# Patient Record
Sex: Male | Born: 1980 | Hispanic: Yes | Marital: Single
Health system: Southern US, Community
[De-identification: ages and names within clinical notes are randomized; demographics above are authoritative.]

---

## 2007-12-20 ENCOUNTER — Emergency Department: Payer: Self-pay

## 2007-12-22 ENCOUNTER — Emergency Department: Payer: Self-pay

## 2009-08-15 IMAGING — US ABDOMEN ULTRASOUND
1 series · 17 of 25 positions shown · non-contrast
Comparison: No comparison

REASON FOR EXAM: vomiting and elevated LFTs
COMMENTS:

PROCEDURE:     US  - US ABDOMEN GENERAL SURVEY  - December 20, 2007  [DATE]
RESULT:     History: 27-year-old male with vomiting

[Series 1: abdomen ultrasound · 17 of 48 slices shown]
[im 1/48]
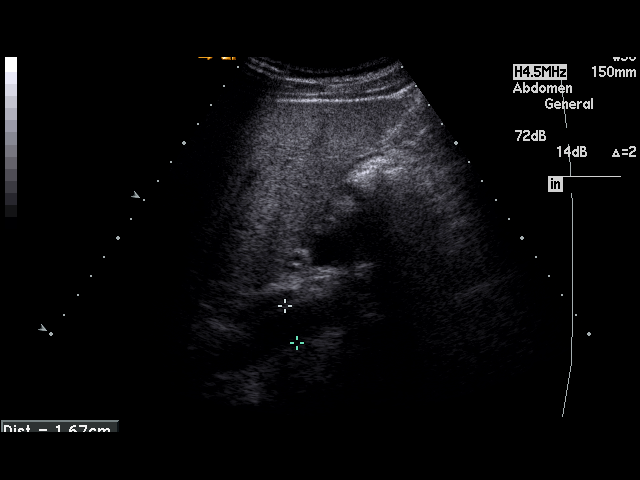
[im 4/48]
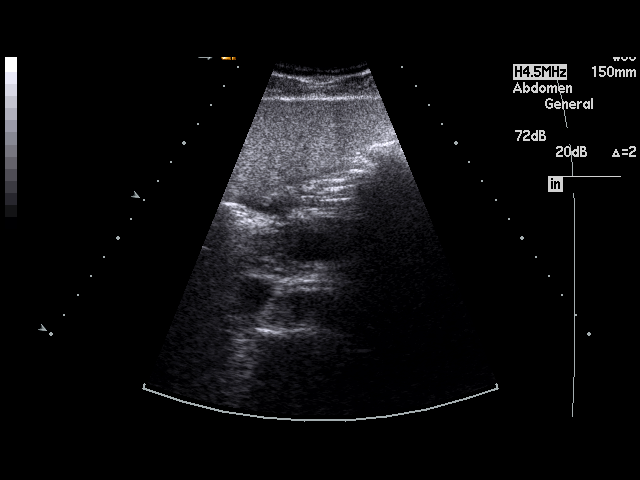
[im 6/48]
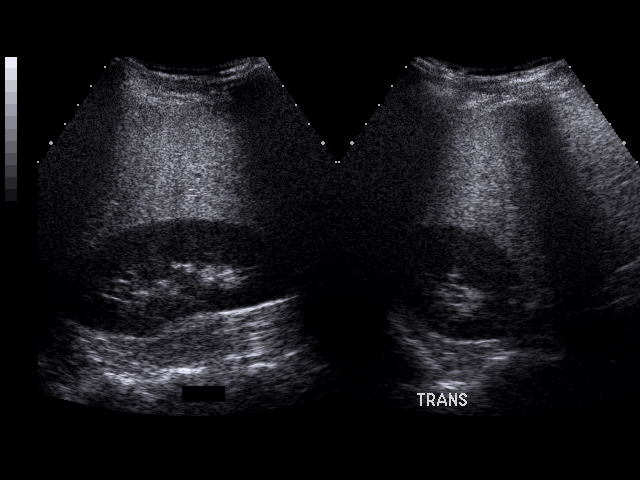
[im 10/48]
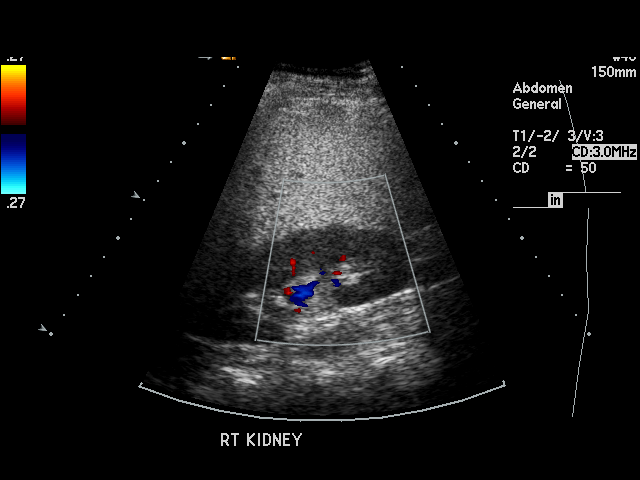
[im 12/48]
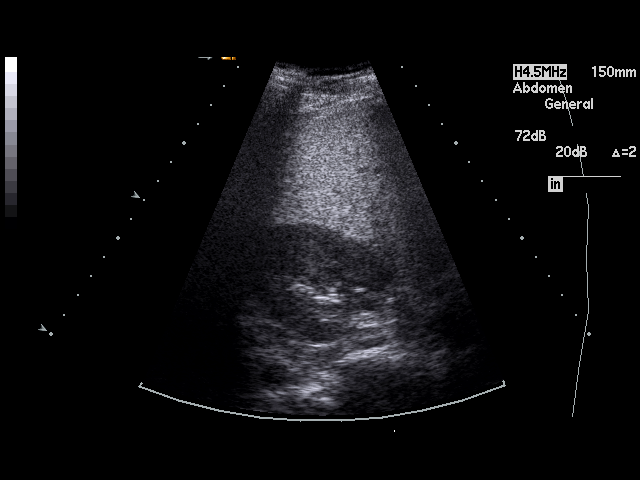
[im 16/48]
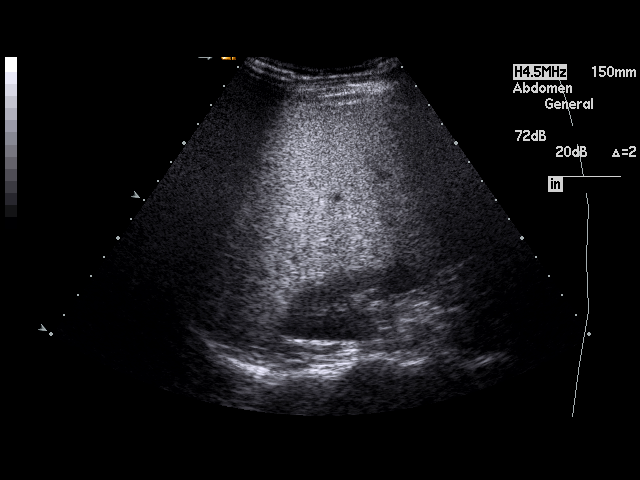
[im 18/48]
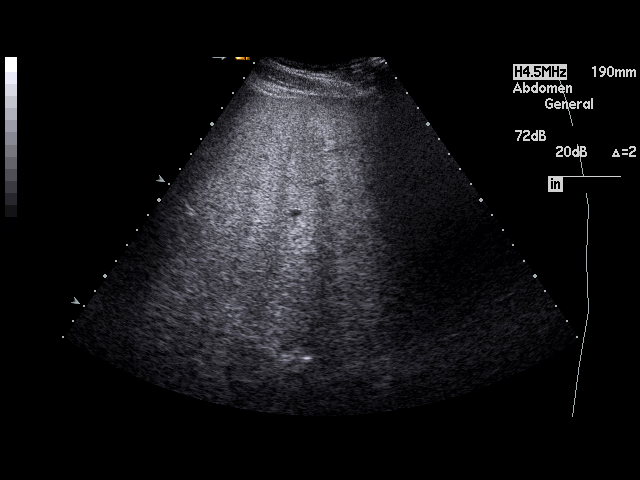
[im 22/48]
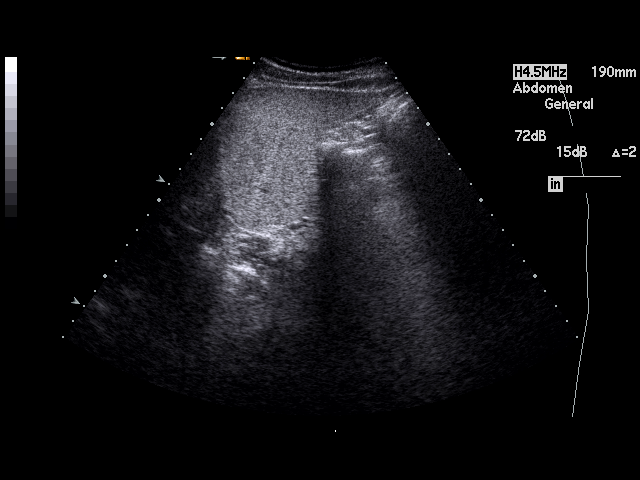
[im 24/48]
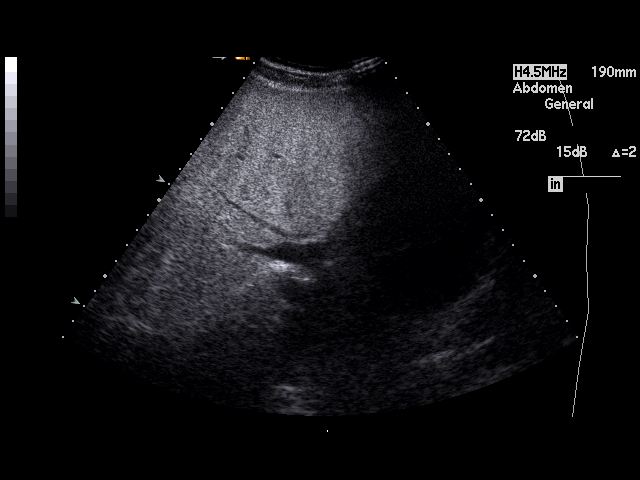
[im 26/48]
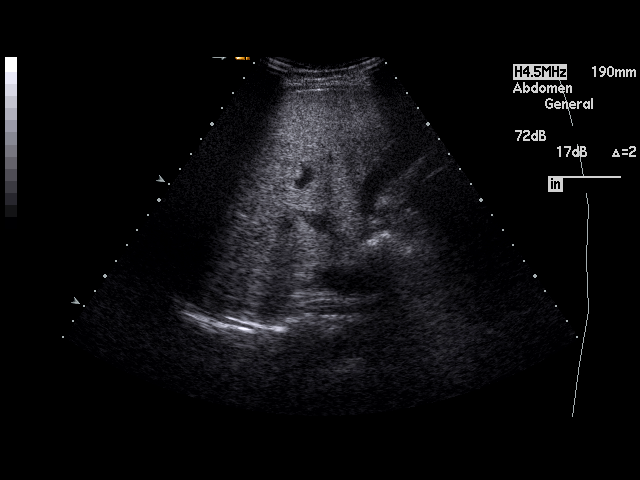
[im 30/48]
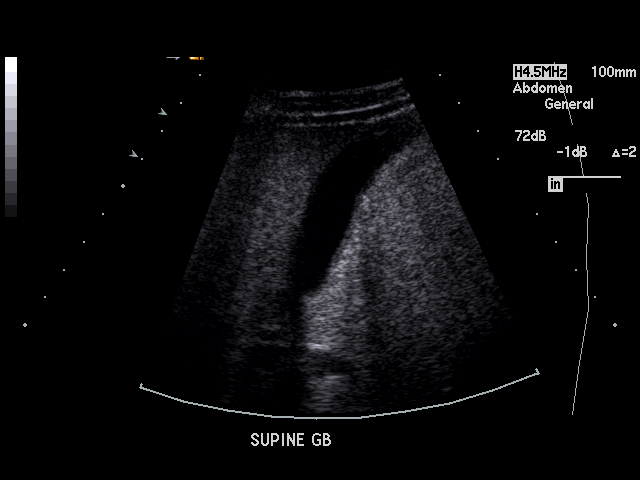
[im 32/48]
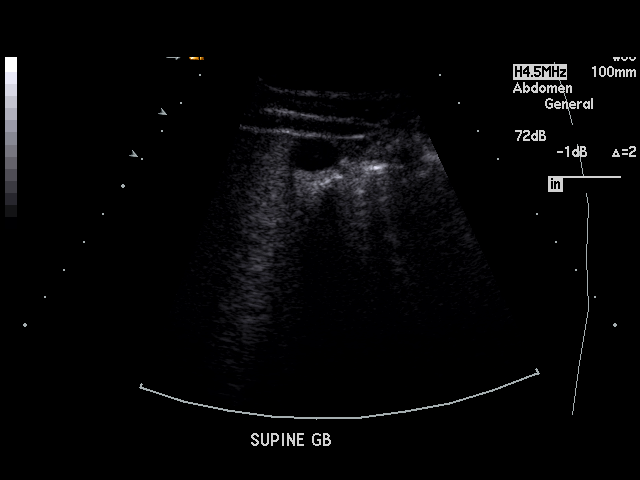
[im 36/48]
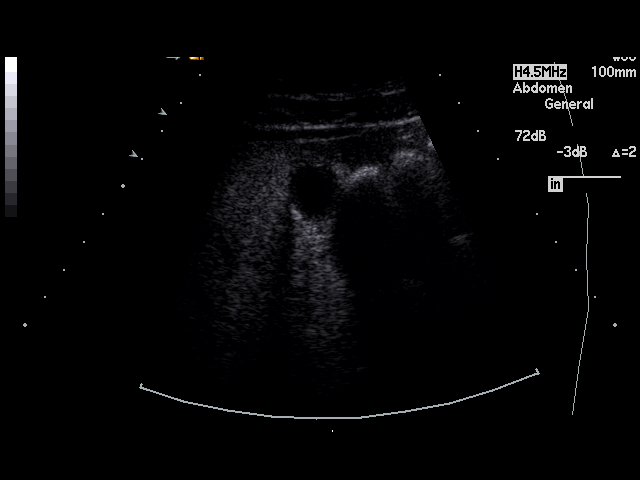
[im 38/48]
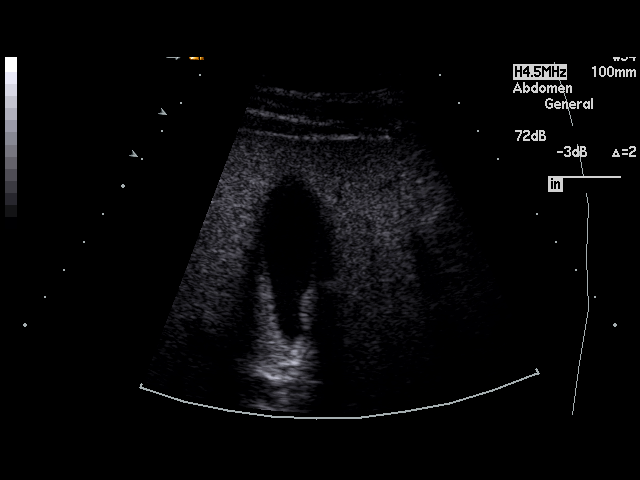
[im 42/48]
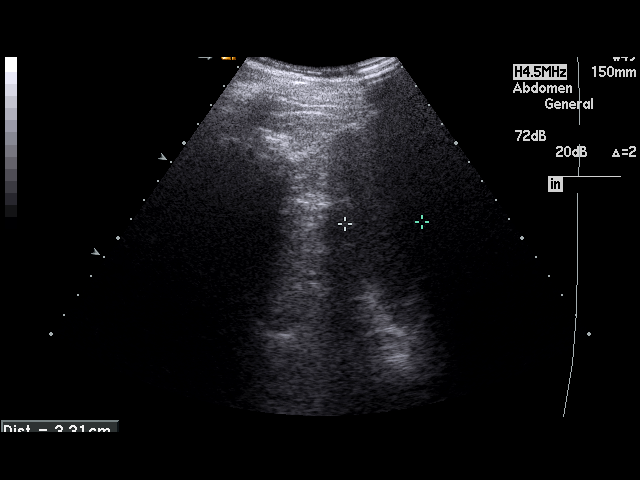
[im 44/48]
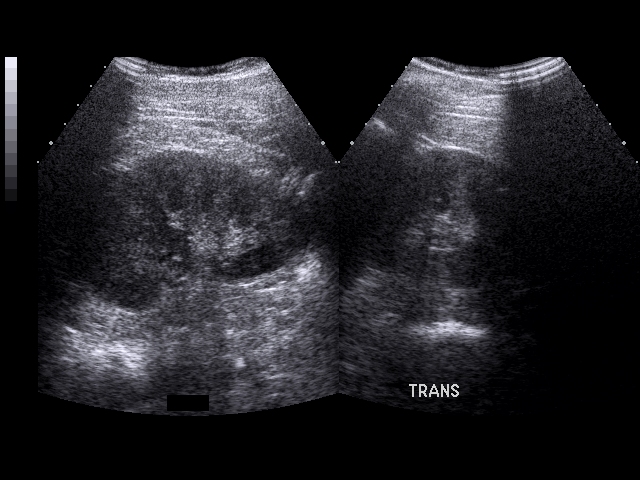
[im 48/48]
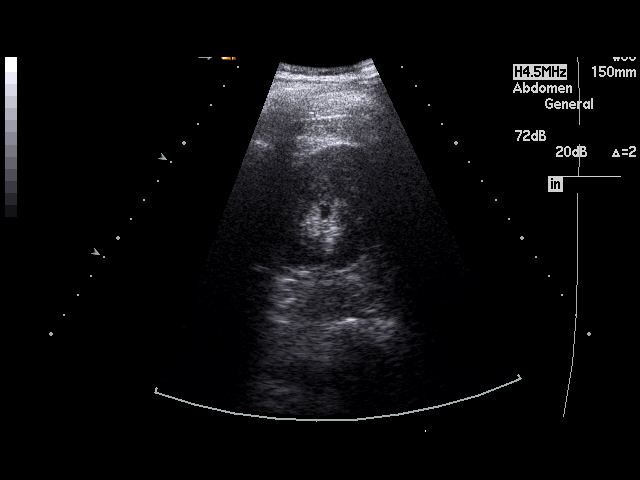

[17 of 25 positions shown; findings below may reference images not displayed]

FINDINGS: The liver is relatively hyperechoic compared to the renal parenchyma
consistent with hepatic steatosis. The liver demonstrates no focal
abnormality.

The  pancreas, spleen, gallbladder, and bilateral kidneys are normal in
echotexture. The right kidney is normal in length measuring 10.9 cm. The
left kidney is normal in length measuring 11.2 cm. There is no
cholelithiasis. There is no pericholecystic fluid or gallbladder wall
thickening. The common bile duct measures 3.8 mm. The abdominal aorta and
IVC are unremarkable.
IMPRESSION: Hepatic steatosis. Otherwise normal abdominal ultrasound.

## 2020-11-13 ENCOUNTER — Ambulatory Visit: Payer: Self-pay | Admitting: General Surgery

## 2020-11-13 NOTE — H&P (Signed)
History of Present Illness Axel Filler MD; 11/13/2020 8:53 AM) The patient is a 40 year old male who presents with an inguinal hernia.  Chief Complaint: Bilateral inguinal hernia Patient is a 40 year old male, Spanish speaking male who comes in secondary to a large right-sided inguinal hernia.  Patient states she's had this ever since she was 40 years old.  He states his gotten larger.  He states he does have pain when he is bending over or lifting something heavy. Patient states that the hernia does reduce on its own when he lays down. Patient has noticed a small bolus left inguinal area as well. Patient had no previous abdominal surgery.     Past Surgical History Rosezella Florida, RN; 11/13/2020 8:43 AM) No pertinent past surgical history    Diagnostic Studies History Rosezella Florida, RN; 11/13/2020 8:43 AM) Colonoscopy   never  Allergies Rosezella Florida, RN; 11/13/2020 8:44 AM) [id number] *MISCELLANEOUS THERAPEUTIC CLASSES*   unknown Allergies Reconciled    Medication History Rosezella Florida, RN; 11/13/2020 8:44 AM) No Current Medications  Medications Reconciled   Social History Rosezella Florida, RN; 11/13/2020 8:43 AM) Alcohol use   Occasional alcohol use. Caffeine use   Carbonated beverages. No drug use   Tobacco use   Current some day smoker.  Family History Rosezella Florida, RN; 11/13/2020 8:43 AM) Diabetes Mellitus   Father, Mother. Hypertension   Father, Mother. Migraine Headache   Mother.  Other Problems Rosezella Florida, RN; 11/13/2020 8:43 AM) No pertinent past medical history      Review of Systems Axel Filler MD; 11/13/2020 8:52 AM) General Present- Appetite Loss. Not Present- Chills, Fatigue, Fever, Night Sweats, Weight Gain and Weight Loss. Skin Not Present- Change in Wart/Mole, Dryness, Hives, Jaundice, New Lesions, Non-Healing Wounds, Rash and Ulcer. HEENT Present- Sore Throat. Not Present- Earache, Hearing Loss, Hoarseness, Nose Bleed, Oral Ulcers, Ringing in the Ears,  Seasonal Allergies, Sinus Pain, Visual Disturbances, Wears glasses/contact lenses and Yellow Eyes. Respiratory Not Present- Bloody sputum, Chronic Cough, Difficulty Breathing, Snoring and Wheezing. Breast Not Present- Breast Mass, Breast Pain, Nipple Discharge and Skin Changes. Cardiovascular Not Present- Chest Pain, Difficulty Breathing Lying Down, Leg Cramps, Palpitations, Rapid Heart Rate, Shortness of Breath and Swelling of Extremities. Gastrointestinal Present- Gets full quickly at meals, Nausea and Vomiting. Not Present- Abdominal Pain, Bloating, Bloody Stool, Change in Bowel Habits, Chronic diarrhea, Constipation, Difficulty Swallowing, Excessive gas, Hemorrhoids, Indigestion and Rectal Pain. Male Genitourinary Not Present- Blood in Urine, Change in Urinary Stream, Frequency, Impotence, Nocturia, Painful Urination, Urgency and Urine Leakage. Musculoskeletal Present- Muscle Pain. Not Present- Back Pain, Joint Pain, Joint Stiffness, Muscle Weakness and Swelling of Extremities. Neurological Not Present- Decreased Memory, Fainting, Headaches, Numbness, Seizures, Tingling, Tremor, Trouble walking and Weakness. Psychiatric Not Present- Anxiety, Bipolar, Change in Sleep Pattern, Depression, Fearful and Frequent crying. Endocrine Present- Hot flashes. Not Present- Cold Intolerance, Excessive Hunger, Hair Changes, Heat Intolerance and New Diabetes. Hematology Not Present- Blood Thinners, Easy Bruising, Excessive bleeding, Gland problems, HIV and Persistent Infections. All other systems negative  Vitals (Diane Herrin RN; 11/13/2020 8:45 AM) 11/13/2020 8:44 AM Weight: 172.38 lb   Height: 69 in  Body Surface Area: 1.94 m   Body Mass Index: 25.46 kg/m   Temp.: 98 F    Pulse: 66 (Regular)    P.OX: 99% (Room air) BP: 130/82(Sitting, Left Arm, Standard)       Physical Exam Axel Filler MD; 11/13/2020 8:53 AM) The physical exam findings are as follows: Note:   Constitutional: No acute distress,  conversant, appears stated age  Eyes: Anicteric sclerae, moist conjunctiva, no lid lag  Neck: No thyromegaly, trachea midline, no cervical lymphadenopathy  Lungs: Clear to auscultation biilaterally, normal respiratory effot  Cardiovascular: regular rate & rhythm, no murmurs, no peripheal edema, pedal pulses 2+  GI: Soft, no masses or hepatosplenomegaly, non-tender to palpation  MSK: Normal gait, no clubbing cyanosis, edema  Skin: No rashes, palpation reveals normal skin turgor  Psychiatric: Appropriate judgment and insight, oriented to person, place, and time  Abdomen Inspection Hernias - Inguinal hernia - Bilateral - Reducible - Bilateral  (Right great left) .    Assessment & Plan Axel Filler MD; 11/13/2020 8:54 AM) BILATERAL INGUINAL HERNIA WITHOUT OBSTRUCTION OR GANGRENE, RECURRENCE NOT SPECIFIED (K40.20) Impression: 40 year old male, with a right inguinal scrotal hernia and small left inguinal hernia.  1. The patient will like to proceed to the operating room for open bilateral inguinal hernia repair with mesh.  2. I discussed with the patient the signs and symptoms of incarceration and strangulation and the need to proceed to the ER should they occur.  3. I discussed with the patient the risks and benefits of the procedure to include but not limited to: Infection, bleeding, damage to surrounding structures, possible need for further surgery, possible nerve pain, and possible recurrence. The patient was understanding and wishes to proceed.
